# Patient Record
Sex: Male | Born: 1990 | Race: White | Hispanic: No | Marital: Single | Smoking: Never smoker
Health system: Southern US, Community
[De-identification: ages and names within clinical notes are randomized; demographics above are authoritative.]

---
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
## 2014-11-02 ENCOUNTER — Emergency Department (HOSPITAL_COMMUNITY): Payer: Self-pay

## 2014-11-02 ENCOUNTER — Emergency Department (HOSPITAL_COMMUNITY)
Admission: EM | Admit: 2014-11-02 | Discharge: 2014-11-02 | Disposition: A | Discharge status: Home / Self Care | Payer: Self-pay | Attending: Emergency Medicine | Admitting: Emergency Medicine

## 2014-11-02 ENCOUNTER — Encounter (HOSPITAL_COMMUNITY): Payer: Self-pay | Admitting: Emergency Medicine

## 2014-11-02 DIAGNOSIS — Y9351 Activity, roller skating (inline) and skateboarding: Secondary | ICD-10-CM | POA: Insufficient documentation

## 2014-11-02 DIAGNOSIS — Y929 Unspecified place or not applicable: Secondary | ICD-10-CM | POA: Insufficient documentation

## 2014-11-02 DIAGNOSIS — Y999 Unspecified external cause status: Secondary | ICD-10-CM | POA: Insufficient documentation

## 2014-11-02 DIAGNOSIS — S52131A Displaced fracture of neck of right radius, initial encounter for closed fracture: Secondary | ICD-10-CM | POA: Insufficient documentation

## 2014-11-02 DIAGNOSIS — S52121A Displaced fracture of head of right radius, initial encounter for closed fracture: Secondary | ICD-10-CM | POA: Insufficient documentation

## 2014-11-02 MED ORDER — HYDROCODONE-ACETAMINOPHEN 5-325 MG PO TABS
1.0000 | ORAL_TABLET | Freq: Once | ORAL | Status: AC
  Administered 2014-11-02: 1 via ORAL
  Filled 2014-11-02: qty 1

## 2014-11-02 MED ORDER — HYDROCODONE-ACETAMINOPHEN 5-325 MG PO TABS: ORAL_TABLET | ORAL | Status: AC

## 2014-11-02 NOTE — ED Provider Notes (Signed)
CSN: [id number]     Arrival date & time 11/02/14  2041 History  This chart was scribed for non-physician practitioner working, [employer] Loana Salvaggio, PA-C,  with Dustin MawKristen N Ward, DO, by Dustin Trevino, ED Scribe. This patient was seen in room TR07C/TR07C and the patient's care was started at 9:13 PM.   Chief Complaint  Patient presents with  . Elbow Pain  . Arm Pain  . Fall   The history is provided by the patient. No language interpreter was used.   HPI Comments: Dustin Trevino is a 24 y.o. male who presents to the Emergency Department complaining of a fall that occurred 2 days ago. He reports that he fell from his skateboard and landed on his right elbow and forearm 2 days ago. He denies any LOC. He states that he was not evaluated at the time. He states that he has been having constant moderate right forearm and elbow pain with swelling. He reports that the pain and swelling overall has been improving, but his ROM is unchanged and still limited due to pain. He states that moving his right elbow exacerbates the pain. He currently rates the severity of the pain as a 4/10 and a 7/10 at is worst during movement. He states that he has been taking ibuprofen with minimal relief.   History reviewed. No pertinent past medical history. History reviewed. No pertinent past surgical history. No family history on file. History  Substance Use Topics  . Smoking status: Never Smoker   . Smokeless tobacco: Not on file  . Alcohol Use: No    Review of Systems A complete 10 system review of systems was obtained and all systems are negative except as noted in the HPI and PMH.   Allergies  Review of patient's allergies indicates not on file.  Home Medications   Prior to Admission medications   Medication Sig Start Date End Date Taking? Authorizing Provider  HYDROcodone-acetaminophen (NORCO/VICODIN) 5-325 MG per tablet Take 1-2 tablets by mouth every 6 hours as needed for pain and/or cough. 11/02/14   Dustin Marxen, PA-C   BP 137/67 mmHg  Pulse 63  Temp(Src) 97.8 F (36.6 C) (Oral)  Resp 16  SpO2 98% Physical Exam  Constitutional: He is oriented to person, place, and time. He appears well-developed and well-nourished. No distress.  HENT:  Head: Normocephalic and atraumatic.  Neck: Neck supple. No tracheal deviation present.  Cardiovascular: Normal rate.   Pulmonary/Chest: Effort normal. No respiratory distress.  Musculoskeletal: Normal range of motion. He exhibits no edema or tenderness.  No deformity, slight swelling to elbow and forearm. No bony tenderness to palpation along the olecranon, forearm and wrist including snuffbox. Radial pulses 2+, excellent range of motion to shoulder, elbow and fingers. Patient has good range of motion to wrist but upon supination he states that he has pain radiating up the arm.  Neurological: He is alert and oriented to person, place, and time.  Skin: Skin is warm and dry.  Psychiatric: He has a normal mood and affect. His behavior is normal.  Nursing note and vitals reviewed.   ED Course  Procedures (including critical care time)\  SPLINT APPLICATION Date/Time: 10:11 PM Authorized by: Dustin EmeryPISCIOTTA, Dustin Trevino Consent: Verbal consent obtained. Risks and benefits: risks, benefits and alternatives were discussed Consent given by: patient Splint applied by: orthopedic technician Location details: Right arm  Splint type: long arm Supplies used: Orthoglass Post-procedure: The splinted body part was neurovascularly unchanged following the procedure. Patient tolerance: Patient tolerated the procedure well  with no immediate complications.   DIAGNOSTIC STUDIES: Oxygen Saturation is 98% on RA, normal by my interpretation.    COORDINATION OF CARE: 9:17 PM- Pt advised of plan for treatment which includes radiology and pt agrees.  Labs Review Labs Reviewed - No data to display  Imaging Review Dg Elbow Complete Right  11/02/2014   CLINICAL DATA:  Right  elbow pain after a fall.  EXAM: RIGHT ELBOW - COMPLETE 3+ VIEW  COMPARISON:  None.  FINDINGS: Oblique fracture of the right radial head and neck extending to the articular surface. Mild depression at the articular surface. No significant displacement. Small right elbow effusion.  IMPRESSION: Intraarticular fracture of the right radial head/neck.   Electronically Signed   By: Burman NievesWilliam  Trevino M.D.   On: 11/02/2014 21:38   Dg Forearm Right  11/02/2014   CLINICAL DATA:  Elbow pain after a fall.  EXAM: RIGHT FOREARM - 2 VIEW  COMPARISON:  None.  FINDINGS: Minimally displaced fracture of the right radial head and neck with slight cortical depression at the articular surface. The remainder of the right radius and ulna are otherwise unremarkable. No additional fractures identified. Soft tissues are unremarkable.  IMPRESSION: Fracture the right radial head/neck with extension to the articular surface.   Electronically Signed   By: Burman NievesWilliam  Trevino M.D.   On: 11/02/2014 21:37     EKG Interpretation None      MDM   Final diagnoses:  Radial head fracture, closed, right, initial encounter    Filed Vitals:   11/02/14 2053 11/02/14 2201  BP: 137/67 128/69  Pulse: 63 61  Temp: 97.8 F (36.6 C)   TempSrc: Oral   Resp: 16 18  SpO2: 98% 99%    Medications  HYDROcodone-acetaminophen (NORCO/VICODIN) 5-325 MG per tablet 1 tablet (1 tablet Oral Given 11/02/14 2202)    [name] is a pleasant 24 y.o. male presenting with pain with supination of right (dominant) arm. Patient had slip and fall from a standing height on a skateboard 2 days ago. X-ray shows right radial head and neck fracture with extension into the articular surface. Patient is placed in a long-arm splint, given a sling and advised to follow closely with orthopedist Dr. Magnus IvanBlackman. We've had an extensive discussion of return precautions patient verbalizes understanding.  Evaluation does not show pathology that would require ongoing  emergent intervention or inpatient treatment. Pt is hemodynamically stable and mentating appropriately. Discussed findings and plan with patient/guardian, who agrees with care plan. All questions answered. Return precautions discussed and outpatient follow up given.   New Prescriptions   HYDROCODONE-ACETAMINOPHEN (NORCO/VICODIN) 5-325 MG PER TABLET    Take 1-2 tablets by mouth every 6 hours as needed for pain and/or cough.    I personally performed the services described in this documentation, which was scribed in my presence. The recorded information has been reviewed and is accurate.     Dustin Emeryicole Markeith Jue, PA-C 11/02/14 2317  Dustin MawKristen N Ward, DO 11/02/14 [id number]

## 2014-11-02 NOTE — ED Notes (Signed)
Pt states he fell 2 days ago while skateboarding.  C/o pain to R forearm and R elbow.  Pt wearing sling on arrival.  CMS intact.

## 2014-11-02 NOTE — Progress Notes (Signed)
Orthopedic Tech Progress Note Patient Details:  Dustin Trevino 01-Oct-1990 [id number] Applied fiberglass long arm splint to RUE.  Pulses, sensation, motion intact before and after splinting.  Capillary refill less than 2 seconds before  and after splinting.  Placed splinted RUE in arm sling. Ortho Devices Type of Ortho Device: Arm sling, Post (long arm) splint Ortho Device/Splint Location: RUE Ortho Device/Splint Interventions: Application   [name], [name] 11/02/2014, 10:46 PM

## 2014-11-02 NOTE — Discharge Instructions (Signed)
For pain control please take Ibuprofen (also known as Motrin or Advil)  (this is normally 2 over the counter pills) every 6 hours. Take with food to minimize stomach irritation.  Take vicodin for breakthrough pain, do not drink alcohol, drive, care for children or do other critical tasks while taking vicodin.  Do not hesitate to return to the emergency room for any new, worsening or concerning symptoms.  Please obtain primary care using resource guide below. Let them know that you were seen in the emergency room and that they will need to obtain records for further outpatient management.   Elbow Fracture, Simple A fracture is a break in one of the bones.When fractures are not displaced or separated, they may be treated with only a sling or splint. The sling or splint may only be required for two to three weeks. In these cases, often the elbow is put through early range of motion exercises to prevent the elbow from getting stiff. DIAGNOSIS  The diagnosis (learning what is wrong) of a fractured elbow is made by x-ray. These may be required before and after the elbow is put into a splint or cast. X-rays are taken after to make sure the bone pieces have not moved. HOME CARE INSTRUCTIONS   Only take over-the-counter or prescription medicines for pain, discomfort, or fever as directed by your caregiver.  If you have a splint held on with an elastic wrap and your hand or fingers become numb or cold and blue, loosen the wrap and reapply more loosely. See your caregiver if there is no relief.  You may use ice for twenty minutes, four times per day, for the first two to three days.  Use your elbow as directed.  See your caregiver as directed. It is very important to keep all follow-up referrals and appointments in order to avoid any long-term problems with your elbow including chronic pain or stiffness. SEEK IMMEDIATE MEDICAL CARE IF:   There is swelling or increasing pain in elbow.  You begin  to lose feeling or experience numbness or tingling in your hand or fingers.  You develop swelling of the hand and fingers.  You get a cold or blue hand or fingers on affected side. MAKE SURE YOU:   Understand these instructions.  Will watch your condition.  Will get help right away if you are not doing well or get worse. Document Released: 06/02/2001 Document Revised: 08/31/2011 Document Reviewed: 04/23/2009 [facility] Patient Information 2015 [place], [place]. This information is not intended to replace advice given to you by your health care provider. Make sure you discuss any questions you have with your health care provider.   Emergency Department Resource Guide 1) Find a Doctor and Pay Out of Pocket Although you won't have to find out who is covered by your insurance plan, it is a good idea to ask around and get recommendations. You will then need to call the office and see if the doctor you have chosen will accept you as a new patient and what types of options they offer for patients who are self-pay. Some doctors offer discounts or will set up payment plans for their patients who do not have insurance, but you will need to ask so you aren't surprised when you get to your appointment.  2) Contact Your Local Health Department Not all health departments have doctors that can see patients for sick visits, but many do, so it is worth a call to see if yours does. If you don't  know where your local health department is, you can check in your phone book. The CDC also has a tool to help you locate your state's health department, and many state websites also have listings of all of their local health departments.  3) Find a Walk-in Clinic If your illness is not likely to be very severe or complicated, you may want to try a walk in clinic. These are popping up all over the country in pharmacies, drugstores, and shopping centers. They're usually staffed by nurse practitioners or physician assistants  that have been trained to treat common illnesses and complaints. They're usually fairly quick and inexpensive. However, if you have serious medical issues or chronic medical problems, these are probably not your best option.  No Primary Care Doctor: - Call Health Connect at  [phone] - they can help you locate a primary care doctor that  accepts your insurance, provides certain services, etc. - Physician Referral Service- [phone]  Chronic Pain Problems: Organization         Address  Phone   Notes  [facility]  [phone] Patients need to be referred by their primary care doctor.   Medication Assistance: Organization         Address  Phone   Notes  [facility] Medication [facility] [address] [place], [place] [address] [phone] --Must be a resident of [facility] -- Must have NO insurance coverage whatsoever (no Medicaid/ Medicare, etc.) -- The pt. MUST have a primary care doctor that directs their care regularly and follows them in the community   Med[facility]ist  [phone]   [employer]  [phone]    Agencies that provide inexpensive medical care: Organization         Address  Phone   Notes  [facility]  [phone]   [facility]    [phone]   [facility] [address] [place], [place] [address] [phone]   [facility] [address] [place], [place] [phone]   Planned Parenthood    [phone]   [facility]    [phone]   Community Health and [facility]  [address], [place] Phone:  [phone], Fax:  [phone] Hours of Operation:  9 am - 6 pm, M-F.  Also accepts Medicaid/Medicare and self-pay.  [facility]  [address], [place] Phone: [phone], Fax: [phone]. Hours of Operation:  8:30 am - 5:30 pm, M-F.  Also accepts Medicaid  and self-pay.  [facility] [place] [address] [place] Phone: [phone]   [facility] [address] Natasha Bence [place], [place] [phone], Ext. [phone] Mondays & Thursdays: 7-9 AM.  First 15 patients are seen on a first come, first serve basis.    Medicaid-accepting [facility] Providers:  Organization         Address  Phone   Notes  [facility] [address], [place] [phone] Also accepts self-pay patients.  [facility] [address] Laurell Josephs [place], [place]  [phone]   [facility] [address], [place] [phone]   [facility] [address] [place] [phone]   Renaye Rakers [address], [place]   [phone] Only accepts [place] Access  Medicaid patients after they have their name applied to their card.   Self-Pay (no insurance) in [facility]:  Organization         Address  Phone   Notes  Sickle Cell Patients, [facility] [address], [place] [phone]   [facility] [address], [place] [phone]) [phone]   [facility]  [address], [place] [phone]) [phone]   [facility]/Dr. Osei-Bonsu  [address] [place] [address], [place] or [phone], [place] [phone]) [phone] Phone number for both [place] and [place] locations is the same.  [facility]gent [facility] and [facility] [address], [place] [phone]   [facility] [address] [place] [address], [place] or [address] [phone]   [facility] [address], [place] [phone]) [phone], phone; [phone], fax Sees patients 1st and 3rd Saturday of every month.  Must not qualify for public or private insurance (i.e. Medicaid, Medicare, [place] Health Choice, Veterans' Benefits)  Household income  should be no more than 200% of the poverty level The clinic cannot treat you if you are pregnant or think you are pregnant  Sexually transmitted diseases are not treated at the clinic.    Dental Care: Organization         Address  Phone  Notes  [facility] of [facility] [address], [place] [phone]) [phone] Accepts children up to age 22 who are enrolled in [place]icaid or [place] Health Choice; pregnant women with a Medicaid card; and children who have applied for Medicaid or [place] Health Choice, but were declined, whose parents can pay a reduced fee at time of service.  [facility] [place]  [address], [place] [phone]) [phone] Accepts children up to age 22 who are enrolled in [place] or [place] Health Choice; pregnant women with a Medicaid card; and children who have applied for Medicaid or [place] Health Choice, but were declined, whose parents can pay a reduced fee at time of service.  [facility]  [address], [place] [phone] Patients are seen by appointment only. Walk-ins are not accepted. [facility] will see patients 318 years of age and older. Monday - Tuesday (8am-5pm) Most Wednesdays (8:30-5pm) $30 per visit, cash only  [facility]  [address], [facility] [phone] Patients are seen by appointment only. Walk-ins are not accepted. [facility] will see patients 22 years of age and older. One Wednesday Evening (Monthly: Volunteer Based).  $30 per visit, cash only  [employer] [facility] [facility]  [phone]) [phone] for adults; Children under age 364, call Graduate Pediatric Dentistry at [phone]. Children aged 444-14, please call [phone]) [phone] to request a pediatric application.  Dental services are provided in all areas of dental care including fillings, crowns and bridges, complete and partial dentures, implants, gum treatment,  root canals, and extractions. Preventive care is also provided. Treatment is provided to both adults and children. Patients are selected via a lottery and there is often a waiting list.   [facility] [address], [place]  [phone]) [phone] [email or web address]   [facility] [address], [place], [place] [phone], Ext. [phone] Second and Fourth Thursday of each month, opens at 6:30 AM; Clinic ends at 9 AM.  Patients are seen on a first-come first-served basis, and a limited number are seen during each clinic.   [facility]  [address], [place], [place] [phone]) [phone]   Eligibility Requirements You must have lived in [place], [place], or [place] counties for at least the last three months.   You cannot be eligible for state or federal sponsored National Cityhealthcare insurance, including CIGNAVeterans Administration, [place]icaid, or [employer]Medicare.   You generally cannot be eligible for healthcare insurance through your employer.    How to apply: Eligibility screenings are held every Tuesday and Wednesday afternoon from 1:00 pm until 4:00 pm. You do not need an appointment for the interview!  [facility] [address], [place], [place] [phone]   [facility]  [phone]   [facility]  [phone]   [facility]  [phone]    Behavioral Health Resources in the Community: Intensive Outpatient Programs Organization         Address  Phone  Notes  [facility] [address], [place], [place] [phone]   [facility] Outpatient [address], [place], [place] [phone]   [facility] [address], [place], [place]  [phone]   [facility] [address],  [place], [place] [phone] or [phone]   Substance Abuse Resources Organization         Address  Phone  Notes  [facility]   [phone]   [facility]  [phone]   [facility]  [phone]   [facility]  [phone]   Residential & Outpatient Substance Abuse Program  [phone]   Psychological Services Organization         Address  Phone  Notes  [facility]  [phone]- [phone]   [facility]  [phone]- [phone]   [facility] [address], [place] [phone] or [phone]    Mobile Crisis Teams Organization         Address  Phone  Notes  [facility], Mobile Crisis Care Unit  [phone]   [facility]  [address] [place], [place] [phone]   Doristine LocksSharon DeEsch [address] [place] [phone]    Self-Help/Support Groups Organization         Address  Phone             Notes  [facility] [place] - variety of support groups  336- [phone] Call for more information  Narcotics Anonymous (NA), [facility] [address], [place]   2 meetings at this location   Statisticianesidential Treatment Programs Organization         Address  Phone  Notes  [facility] [address],    [place]  [phone]   [facility]  [address], [place] [phone], [place], [place] [phone]   [facility] [address], [place] [phone] Admissions: 8am-3pm M-F  [facility] [address]    [place], [place] [phone]   [facility] [address], [place], [place] [phone]   [facility] [address]  [place], [place] [phone]   [facility] - Intensive Outpatient [address], [place], [place] [phone]   [facility] ([facility] [address]  [place], [place] [phone] or [phone]   [facility] ([facility]) [address]  Sherian Maroon [place], [place] [phone] Accepts Medicaid  [facility] [place] [address],  [place] [phone] Substance Abuse/Addiction Treatment   [facility] Organization         Address  Phone  Notes  [facility]  [phone]   Angie Fava, PhD [address] Ervin Knack [place], [place]   [phone] or [phone]   [facility]   [address] [place], [place] [phone]   [facility] [address], [place], [place] [phone] Insurance/Medicaid/sponsorship through [facility] and Families [address]                                    [place], [place] [phone] Therapy/tele-psych/case  [facility] [address] [place], [place] [phone]    Dr. Lolly Mustache  [phone]   [facility] [place]  [employer] [facility] Dept. 1) [address] [place] 2) [address] [place] 3)  [address], [place] [phone]  [phone]   [facility] Child Abuse Hotline 647-443-4432 or [phone] (After Hours)

## 2016-12-09 IMAGING — CR DG ELBOW COMPLETE 3+V*R*
4 series · 4 of 4 positions shown · non-contrast
Comparison: None.

CLINICAL DATA: Right elbow pain after a fall.

EXAM:
RIGHT ELBOW - COMPLETE 3+ VIEW

[elbow ap]
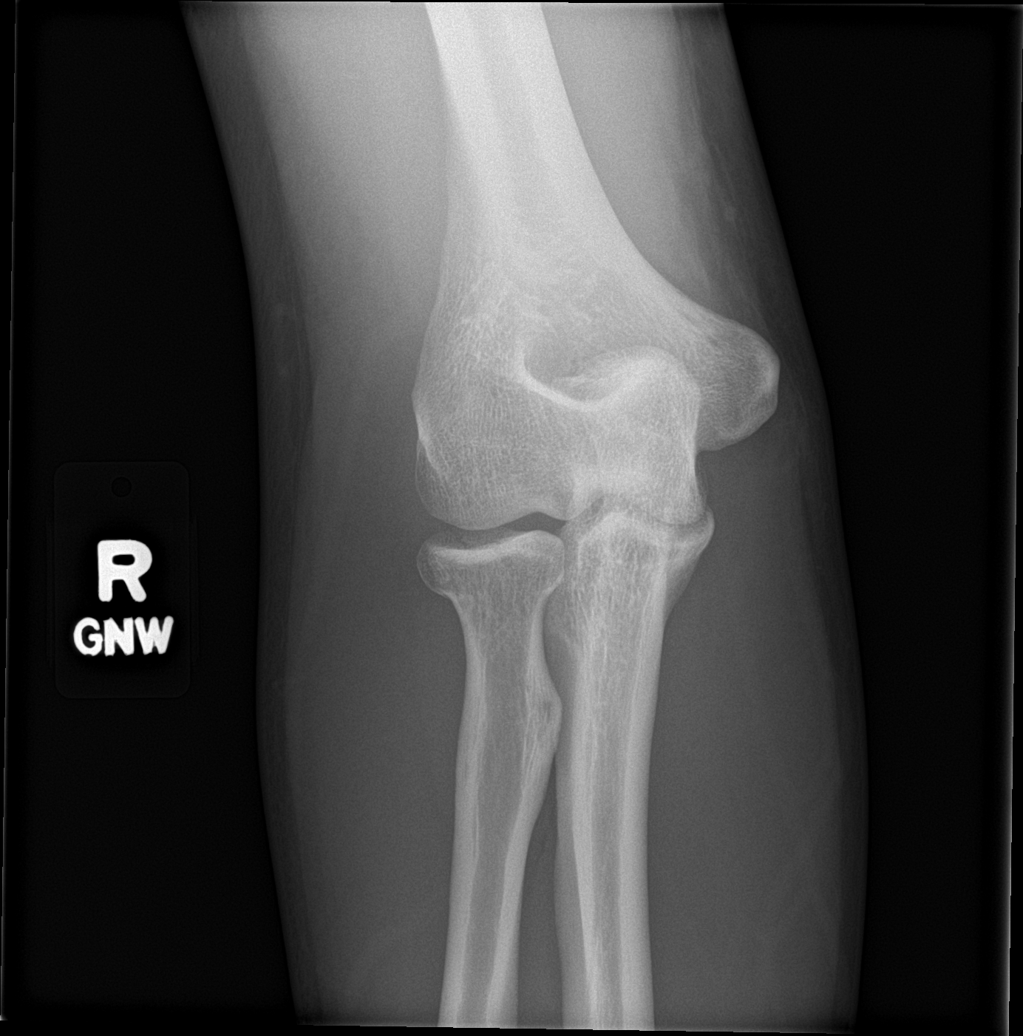

[elbow obl (1 of 2)]
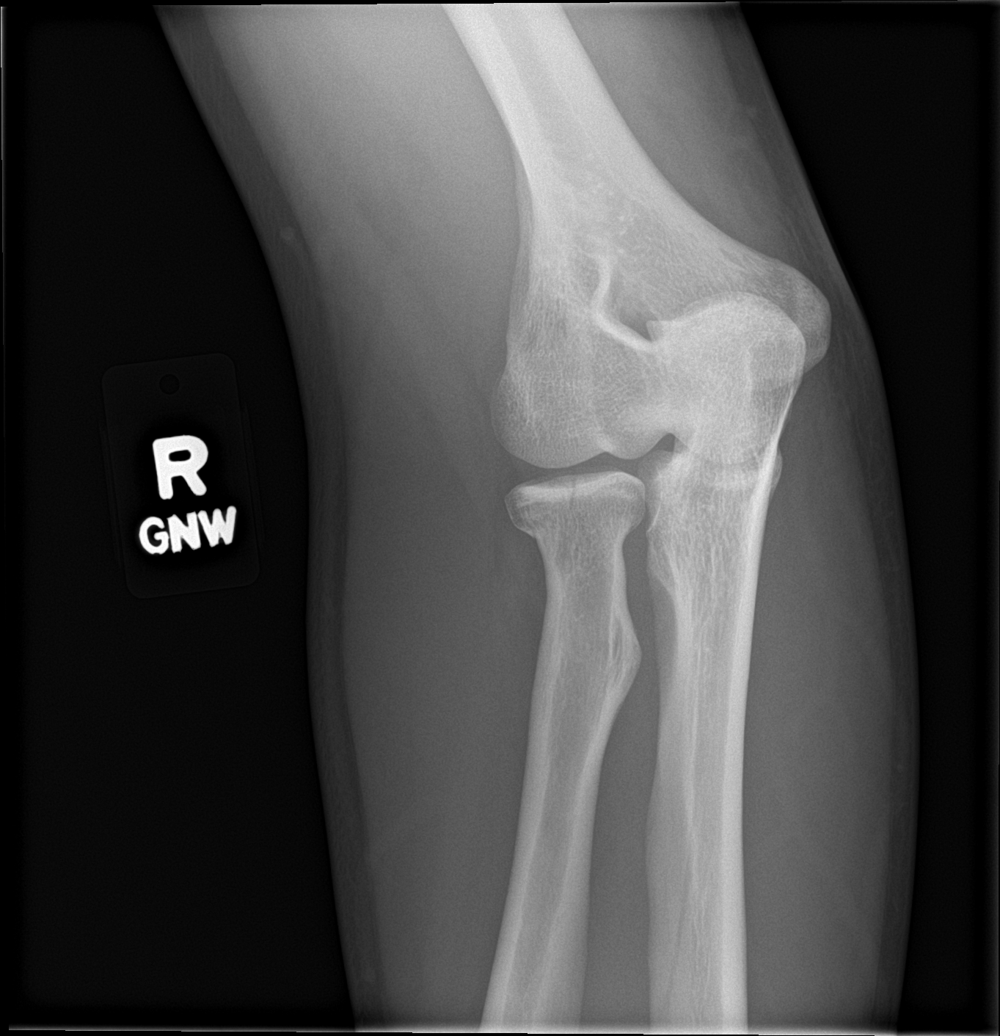

[elbow obl (2 of 2)]
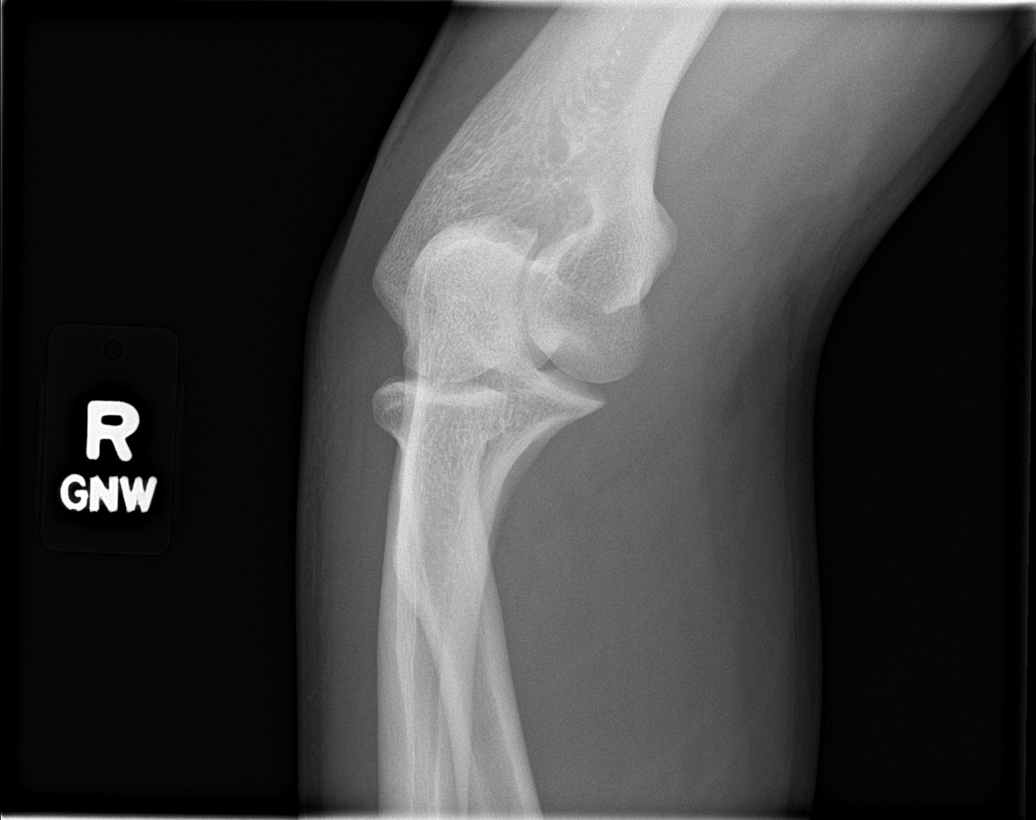

[elbow lat]
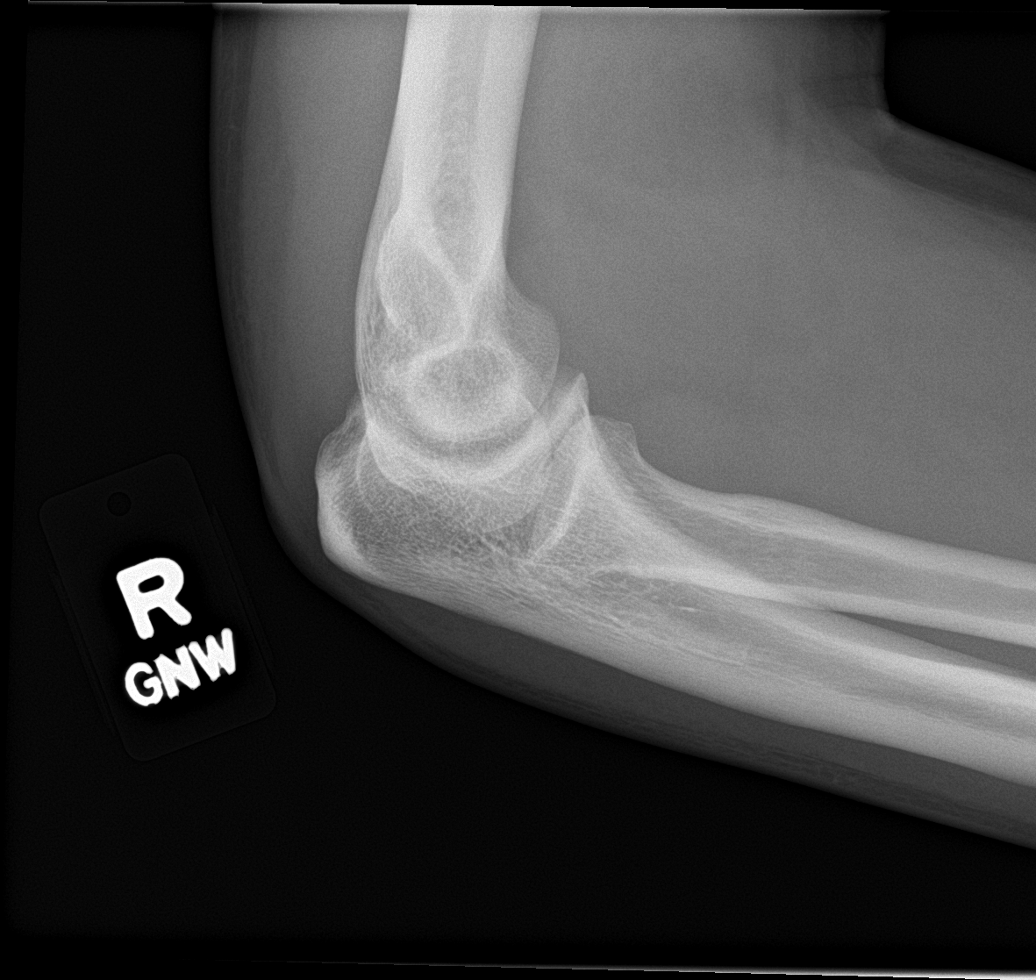

[4 of 4 positions shown; findings below may reference images not displayed]

FINDINGS: Oblique fracture of the right radial head and neck extending to the
articular surface. Mild depression at the articular surface. No
significant displacement. Small right elbow effusion.
IMPRESSION: Intraarticular fracture of the right radial head/neck.
